# Patient Record
Sex: Female | Born: 1955 | Race: White | Hispanic: No | Marital: Single | Smoking: Former smoker
Health system: Southern US, Community
[De-identification: ages and names within clinical notes are randomized; demographics above are authoritative.]

---

## 1997-09-13 ENCOUNTER — Ambulatory Visit (HOSPITAL_COMMUNITY): Admission: RE | Admit: 1997-09-13 | Discharge: 1997-09-13 | Payer: Self-pay | Admitting: Obstetrics and Gynecology

## 1997-10-18 ENCOUNTER — Ambulatory Visit (HOSPITAL_COMMUNITY): Admission: RE | Admit: 1997-10-18 | Discharge: 1997-10-18 | Payer: Self-pay | Admitting: *Deleted

## 1998-05-21 ENCOUNTER — Other Ambulatory Visit: Admission: RE | Admit: 1998-05-21 | Discharge: 1998-05-21 | Payer: Self-pay | Admitting: Obstetrics and Gynecology

## 1998-06-10 ENCOUNTER — Other Ambulatory Visit: Admission: RE | Admit: 1998-06-10 | Discharge: 1998-06-10 | Payer: Self-pay | Admitting: Plastic Surgery

## 1999-05-13 ENCOUNTER — Other Ambulatory Visit: Admission: RE | Admit: 1999-05-13 | Discharge: 1999-05-13 | Payer: Self-pay | Admitting: Obstetrics and Gynecology

## 2000-07-13 ENCOUNTER — Other Ambulatory Visit: Admission: RE | Admit: 2000-07-13 | Discharge: 2000-07-13 | Payer: Self-pay | Admitting: Obstetrics and Gynecology

## 2001-08-08 ENCOUNTER — Other Ambulatory Visit: Admission: RE | Admit: 2001-08-08 | Discharge: 2001-08-08 | Payer: Self-pay | Admitting: Obstetrics and Gynecology

## 2003-07-17 ENCOUNTER — Other Ambulatory Visit: Admission: RE | Admit: 2003-07-17 | Discharge: 2003-07-17 | Payer: Self-pay | Admitting: Obstetrics and Gynecology

## 2009-02-05 ENCOUNTER — Emergency Department (HOSPITAL_BASED_OUTPATIENT_CLINIC_OR_DEPARTMENT_OTHER): Admission: EM | Admit: 2009-02-05 | Discharge: 2009-02-05 | Payer: Self-pay | Admitting: Emergency Medicine

## 2009-02-05 ENCOUNTER — Ambulatory Visit: Payer: Self-pay | Admitting: Radiology
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
## 2015-12-15 ENCOUNTER — Ambulatory Visit: Payer: Self-pay | Admitting: Podiatry

## 2016-01-14 ENCOUNTER — Telehealth: Payer: Self-pay | Admitting: *Deleted

## 2016-01-14 ENCOUNTER — Encounter: Payer: Self-pay | Admitting: Podiatry

## 2016-01-14 ENCOUNTER — Ambulatory Visit (INDEPENDENT_AMBULATORY_CARE_PROVIDER_SITE_OTHER): Payer: Managed Care, Other (non HMO)

## 2016-01-14 ENCOUNTER — Ambulatory Visit (INDEPENDENT_AMBULATORY_CARE_PROVIDER_SITE_OTHER): Payer: Managed Care, Other (non HMO) | Admitting: Podiatry

## 2016-01-14 VITALS — BP 139/78 | HR 61 | Resp 16 | Ht 66.0 in | Wt 163.0 lb

## 2016-01-14 DIAGNOSIS — M79671 Pain in right foot: Secondary | ICD-10-CM

## 2016-01-14 DIAGNOSIS — M21619 Bunion of unspecified foot: Secondary | ICD-10-CM

## 2016-01-14 DIAGNOSIS — B351 Tinea unguium: Secondary | ICD-10-CM | POA: Diagnosis not present

## 2016-01-14 DIAGNOSIS — L309 Dermatitis, unspecified: Secondary | ICD-10-CM | POA: Diagnosis not present

## 2016-01-14 NOTE — Progress Notes (Signed)
   Subjective:    Patient ID: Cindy Cox, female    DOB: 21-Nov-1955, 60 y.o.   MRN: [id number]  HPI    Review of Systems     Objective:   Physical Exam        Assessment & Plan:

## 2016-01-14 NOTE — Patient Instructions (Signed)
Bunionectomy A bunionectomy is a surgical procedure to remove a bunion. A bunion is a visible bump of bone on the inside of your foot where your big toe meets the rest of your foot. A bunion can develop when pressure turns this bone (first metatarsal) toward the other toes. Shoes that are too tight are the most common cause of bunions. Bunions can also be caused by diseases, such as arthritis and polio. You may need a bunionectomy if your bunion is very large and painful or it affects your ability to walk. LET YOUR HEALTH CARE PROVIDER KNOW ABOUT:  Any allergies you have.  All medicines you are taking, including vitamins, herbs, eye drops, creams, and over-the-counter medicines.  Previous problems you or members of your family have had with the use of anesthetics.  Any blood disorders you have.  Previous surgeries you have had.  Medical conditions you have. RISKS AND COMPLICATIONS  Generally, this is a safe procedure. However, problems may occur, including:  Infection.  Pain.  Nerve damage.  Bleeding or blood clots.  Reactions to medicines.  Numbness, stiffness, or arthritis in your toe.  Foot problems that continue even after the procedure. BEFORE THE PROCEDURE  Ask your health care provider about:  Changing or stopping your regular medicines. This is especially important if you are taking diabetes medicines or blood thinners.  Taking medicines such as aspirin and ibuprofen. These medicines can thin your blood. Do not take these medicines before your procedure if your health care provider instructs you not to.  Do not drink alcohol before the procedure as directed by your health care provider.  Do not use tobacco products, including cigarettes, chewing tobacco, or electronic cigarettes, before the procedure as directed by your health care provider. If you need help quitting, ask your health care provider.  Ask your health care provider what kind of medicine you will be  given during your procedure. A bunionectomy may be done using one of these:  A medicine that numbs the area (local anesthetic).  A medicine that makes you go to sleep (general anesthetic). If you will be given general anesthetic, do not eat or drink anything after midnight on the night before the procedure or as directed by your health care provider. PROCEDURE  An IV tube may be inserted into a vein.  You will be given local anesthetic or general anesthetic.  The surgeon will make a cut (incision) over the enlarged area at the first joint of the big toe. The surgeon will remove the bunion.  You may have more than one incision if any of the bones in your big toe need to be moved. A bone itself may need to be cut.  Sometimes the tissues around the big toe may also need to be cut then tightened or loosened to reposition the toe.  Screws or other hardware may be used to keep your foot in thecorrect position.  The incision will be closed with stitches (sutures) and covered with adhesive strips or another type of bandage (dressing). AFTER THE PROCEDURE  You may spend some time in a recovery area.  Your blood pressure, heart rate, breathing rate, and blood oxygen level will be monitored often until the medicines you were given have worn off.   This information is not intended to replace advice given to you by your health care provider. Make sure you discuss any questions you have with your health care provider.   Document Released: 02/19/2005 Document Revised: 11/27/2014 Document Reviewed: 10/24/2013   Elsevier Interactive Patient Education 2016 [employer]  

## 2016-01-14 NOTE — Progress Notes (Signed)
   Subjective:    Patient ID: Cindy GinsbergJennifer A Cox, female    DOB: [birth year], 60 y.o.   MRN: [id number]  HPI Chief Complaint  Patient presents with  . Nail Problem    Bilateral; great toes; nail discoloration & thickened nail; pt stated, "Has had since had a pedicure 7 yrs ago"      Review of Systems  All other systems reviewed and are negative.      Objective:   Physical Exam        Assessment & Plan:

## 2016-01-14 NOTE — Telephone Encounter (Signed)
Returned patient's phone call at [phone] (Cell #). I left a voicemail message for patient. Waiting for a response. Pt came in today and works for [employer]. She took her nail fungus pathology to [facility] to be processed. Waiting for a response.

## 2016-01-15 NOTE — Progress Notes (Signed)
Subjective:     Patient ID: Cindy Cox, female   DOB: July 18, 1955, 60 y.o.   MRN: [id number]  HPI patient presents stating that she has discoloration of her hallux nail distal left over right from approximate 6 year history and it did occur after pedicure. Patient also complains of moderate bunion deformity right that's increasingly tender and that she's tried wider shoes she's tried soaks and padding of it without relief of symptoms   Review of Systems  All other systems reviewed and are negative.      Objective:   Physical Exam  Constitutional: She is oriented to person, place, and time.  Cardiovascular: Intact distal pulses.   Musculoskeletal: Normal range of motion.  Neurological: She is oriented to person, place, and time.  Skin: Skin is warm.  Nursing note and vitals reviewed.  neurovascular status intact muscle strength adequate range of motion within normal limits with patient found to have distal discoloration of the left hallux nail measuring approximately 5 mm x 3 mm with also some yellow discoloration distal right hallux nail. Patient has a hyperostosis medial aspect first metatarsal head right with redness around the side and pain when palpated with keratotic lesions on the hallux and first metatarsal secondary to structural changes. 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 Structural bunion deformity right symptomatic    Plan:     H&P and both conditions reviewed. I did do a biopsy of the left hallux nail and I am sending in for evaluation and testing and then we will review results and consider laser treatment and possible pulse Lamisil therapy. For the bunion I do think long-term correction would be best for her and she is motivated for this but wants first more  information which I provided and she would reappoint prior to procedure  X-ray report indicates elevation of the intermetatarsal angle of approximately 15 with tibial sesamoidal shift and mild deviation of the hallux right with also some moderate cystic formation on the medial side of the first metatarsal

## 2016-01-21 ENCOUNTER — Telehealth: Payer: Self-pay | Admitting: Podiatry

## 2016-01-21 NOTE — Telephone Encounter (Signed)
Patient called wanting to schedule bunionectomy surgery with Dr. Charlsie Merlesegal after Christmas but before New Years. She has an appointment coming up on 22 November and I already added to the notes for it to also be a surgery consult.

## 2016-01-22 ENCOUNTER — Telehealth: Payer: Self-pay | Admitting: *Deleted

## 2016-01-22 NOTE — Telephone Encounter (Signed)
"  I would like to schedule surgery with Dr. Charlsie Merlesegal 22nd of December or the 26th.  Please give me a call."

## 2016-01-28 NOTE — Telephone Encounter (Signed)
I attempted to return her call.  I left her a message to call me tomorrow. 

## 2016-02-02 NOTE — Telephone Encounter (Signed)
I left patient a message that we can schedule surgery for December 26.  December 22 is not available because Dr. Charlsie Merlesegal only does surgeries on Tuesday.  Patient is scheduled for a consult appointment on 02/11/2016 with Dr. Charlsie Merlesegal.  I advised her to call if she has any other concerns.

## 2016-02-04 ENCOUNTER — Telehealth: Payer: Self-pay | Admitting: Podiatry

## 2016-02-04 NOTE — Telephone Encounter (Signed)
I called patient to let her know I had just hung up the phone from [phone] and was told she had all the records they requested and are moving on with the case. I let Victorino DikeJennifer know and told her to call me back if she has any other questions or concerns.

## 2016-02-06 ENCOUNTER — Telehealth: Payer: Self-pay | Admitting: Podiatry

## 2016-02-06 NOTE — Telephone Encounter (Signed)
Pt called stating she may need to cxl appt on 11.22.17 because she is waiting on a letter of necessity for the lab to do a pcr test on her fungal culture that came back negative with the regular test. She needs it asap.

## 2016-02-09 ENCOUNTER — Telehealth: Payer: Self-pay | Admitting: *Deleted

## 2016-02-09 ENCOUNTER — Encounter: Payer: Self-pay | Admitting: *Deleted

## 2016-02-09 NOTE — Telephone Encounter (Signed)
"  Good morning, I'm just calling to cancel my surgical appointment for March 16, 2016.  I guess I am supposed to call the surgical center as well.  Thank you."

## 2016-02-09 NOTE — Telephone Encounter (Signed)
Informed pt her letter of medical necessity was ready for pick up in the [place]. Pt states understanding.

## 2016-02-10 ENCOUNTER — Telehealth: Payer: Self-pay

## 2016-02-10 NOTE — Telephone Encounter (Signed)
Pt came in to the office today to bring back her nail specimen. She states that [facility] cannot run all the necessary tests and she agreed to have specimen sent to [facility]. Paperwork was filled out and specimen will be shipped out this week

## 2016-02-11 ENCOUNTER — Ambulatory Visit: Payer: Managed Care, Other (non HMO) | Admitting: Podiatry

## 2016-02-26 ENCOUNTER — Encounter: Payer: Self-pay | Admitting: Podiatry

## 2016-02-26 NOTE — Progress Notes (Signed)
Gave patient a copy of her x-rays burned to a CD and office visit note from date of service 14 January 2016 that patient walked in and requested. JMS
